# Patient Record
Sex: Female | Born: 1940 | Race: White | Hispanic: No | Marital: Single | Smoking: Never smoker
Health system: Southern US, Community
[De-identification: ages and names within clinical notes are randomized; demographics above are authoritative.]

## PROBLEM LIST (undated history)

## (undated) DIAGNOSIS — F419 Anxiety disorder, unspecified: Secondary | ICD-10-CM

## (undated) DIAGNOSIS — E785 Hyperlipidemia, unspecified: Secondary | ICD-10-CM

## (undated) DIAGNOSIS — E039 Hypothyroidism, unspecified: Secondary | ICD-10-CM

## (undated) DIAGNOSIS — F0280 Dementia in other diseases classified elsewhere without behavioral disturbance: Secondary | ICD-10-CM

## (undated) DIAGNOSIS — F32A Depression, unspecified: Secondary | ICD-10-CM

## (undated) DIAGNOSIS — I4891 Unspecified atrial fibrillation: Secondary | ICD-10-CM

## (undated) DIAGNOSIS — I10 Essential (primary) hypertension: Secondary | ICD-10-CM

---

## 2004-06-30 ENCOUNTER — Ambulatory Visit: Payer: Self-pay

## 2004-07-11 ENCOUNTER — Ambulatory Visit: Payer: Self-pay

## 2005-02-16 ENCOUNTER — Ambulatory Visit: Payer: Self-pay

## 2005-07-03 ENCOUNTER — Ambulatory Visit: Payer: Self-pay

## 2006-09-13 ENCOUNTER — Ambulatory Visit: Payer: Self-pay | Admitting: Family Medicine

## 2007-04-03 ENCOUNTER — Ambulatory Visit: Payer: Self-pay | Admitting: Family Medicine

## 2007-04-25 ENCOUNTER — Ambulatory Visit: Payer: Self-pay | Admitting: Family Medicine

## 2008-02-04 ENCOUNTER — Ambulatory Visit: Payer: Self-pay | Admitting: Family Medicine

## 2009-02-11 IMAGING — CR DG LUMBAR SPINE 2-3V
1 series · 3 of 3 positions shown · non-contrast
Comparison: none

REASON FOR EXAM: PAIN
COMMENTS:

[Series 1: view not recorded · 0.17mm/px · 3 of 3 slices shown]
[im 1/3]
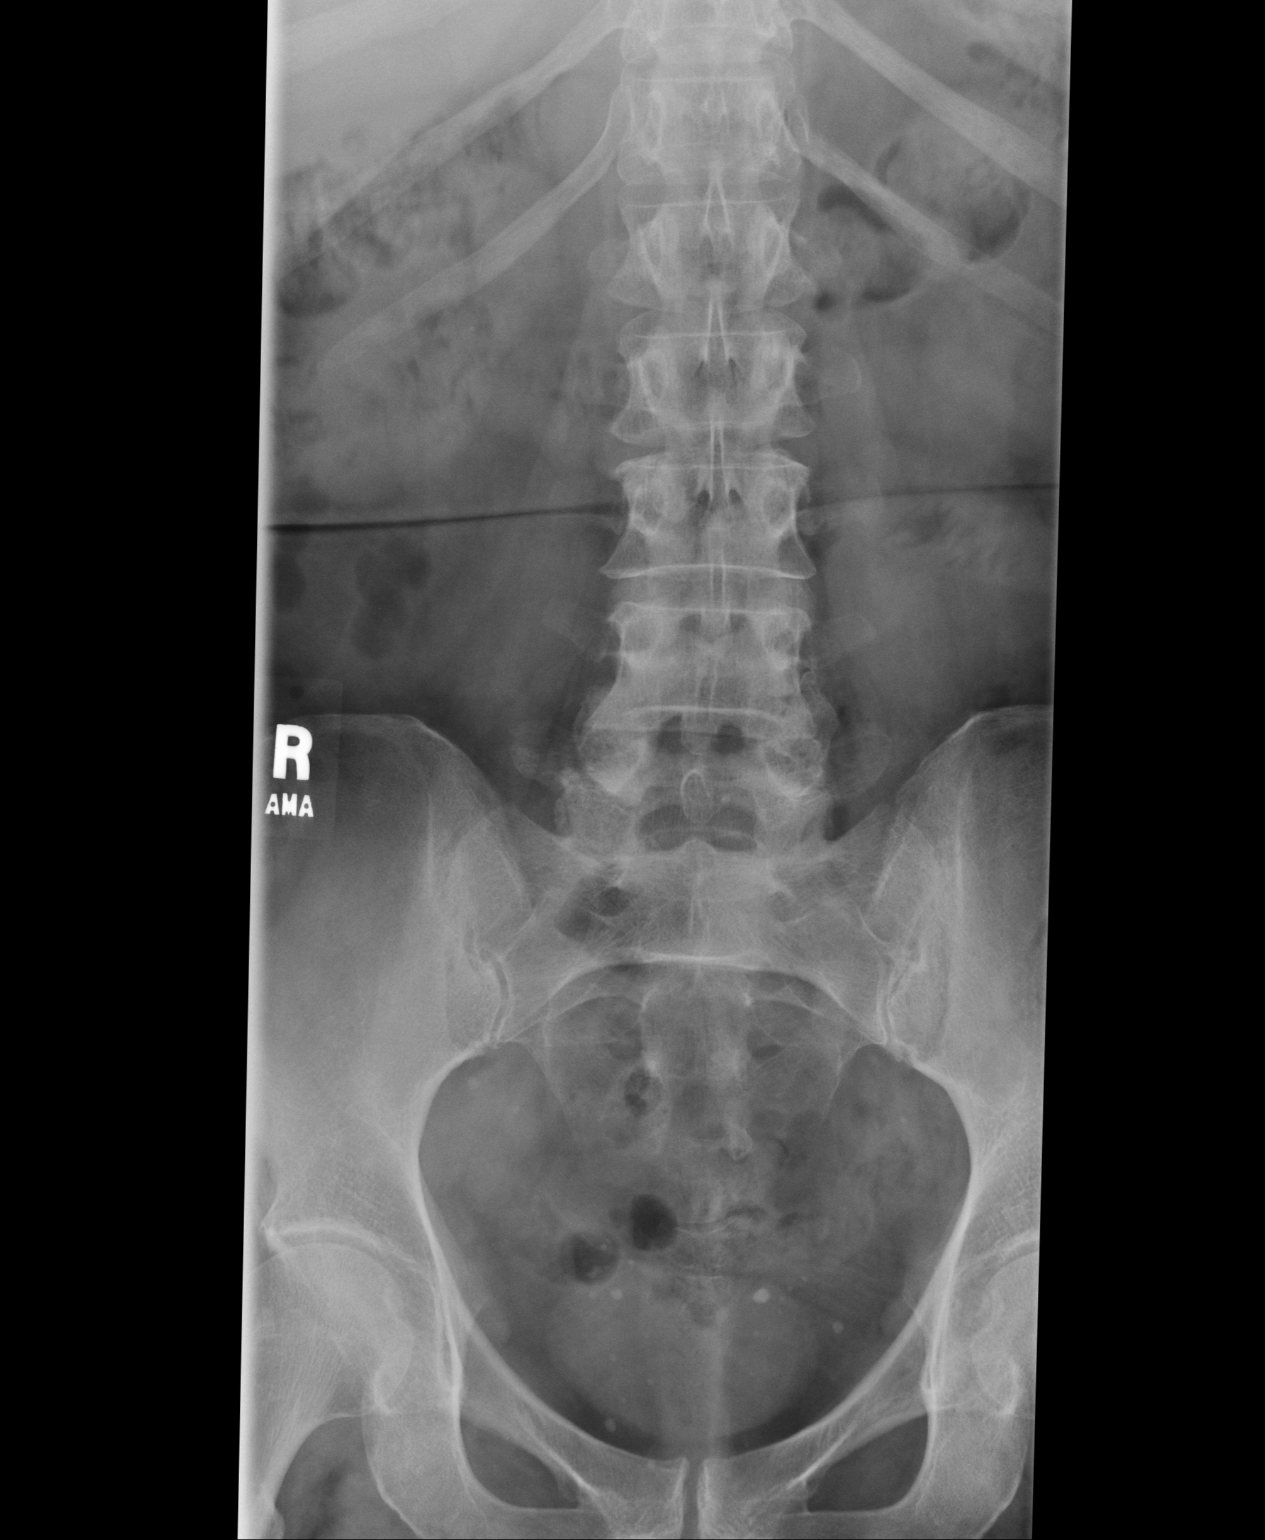
[im 2/3]
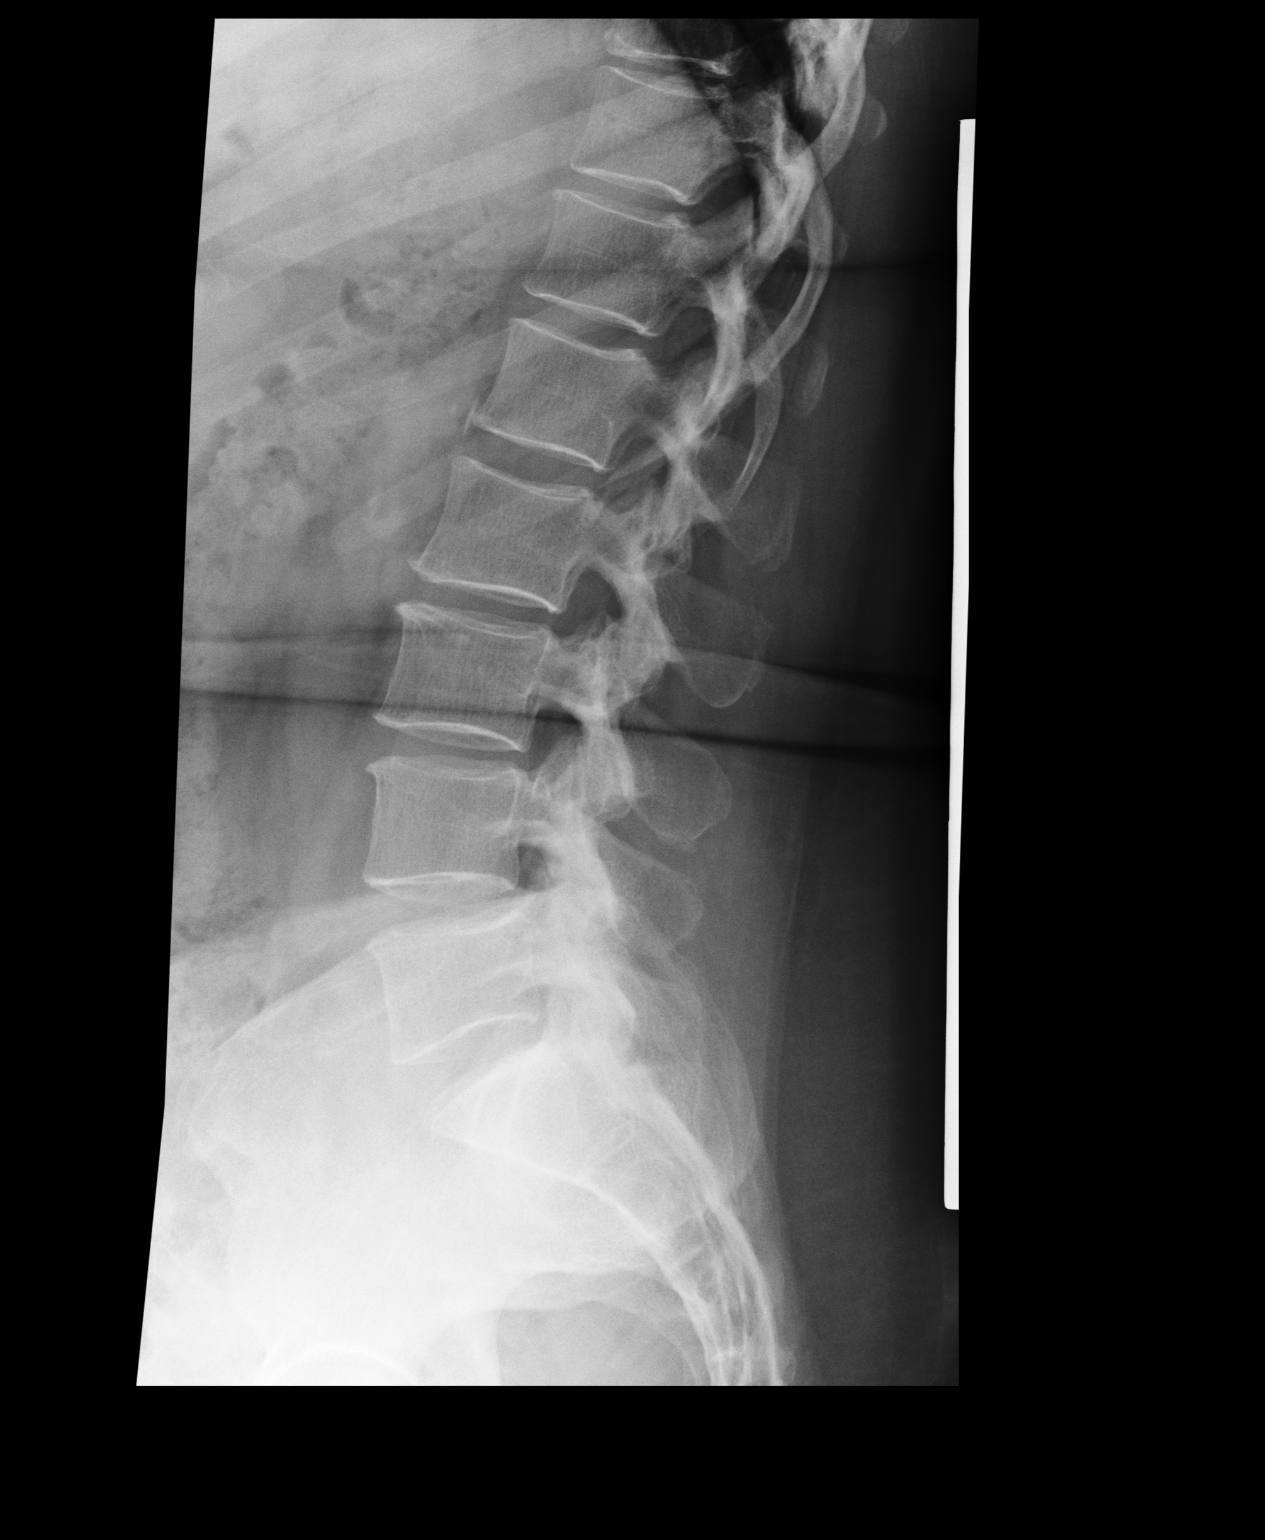
[im 3/3]
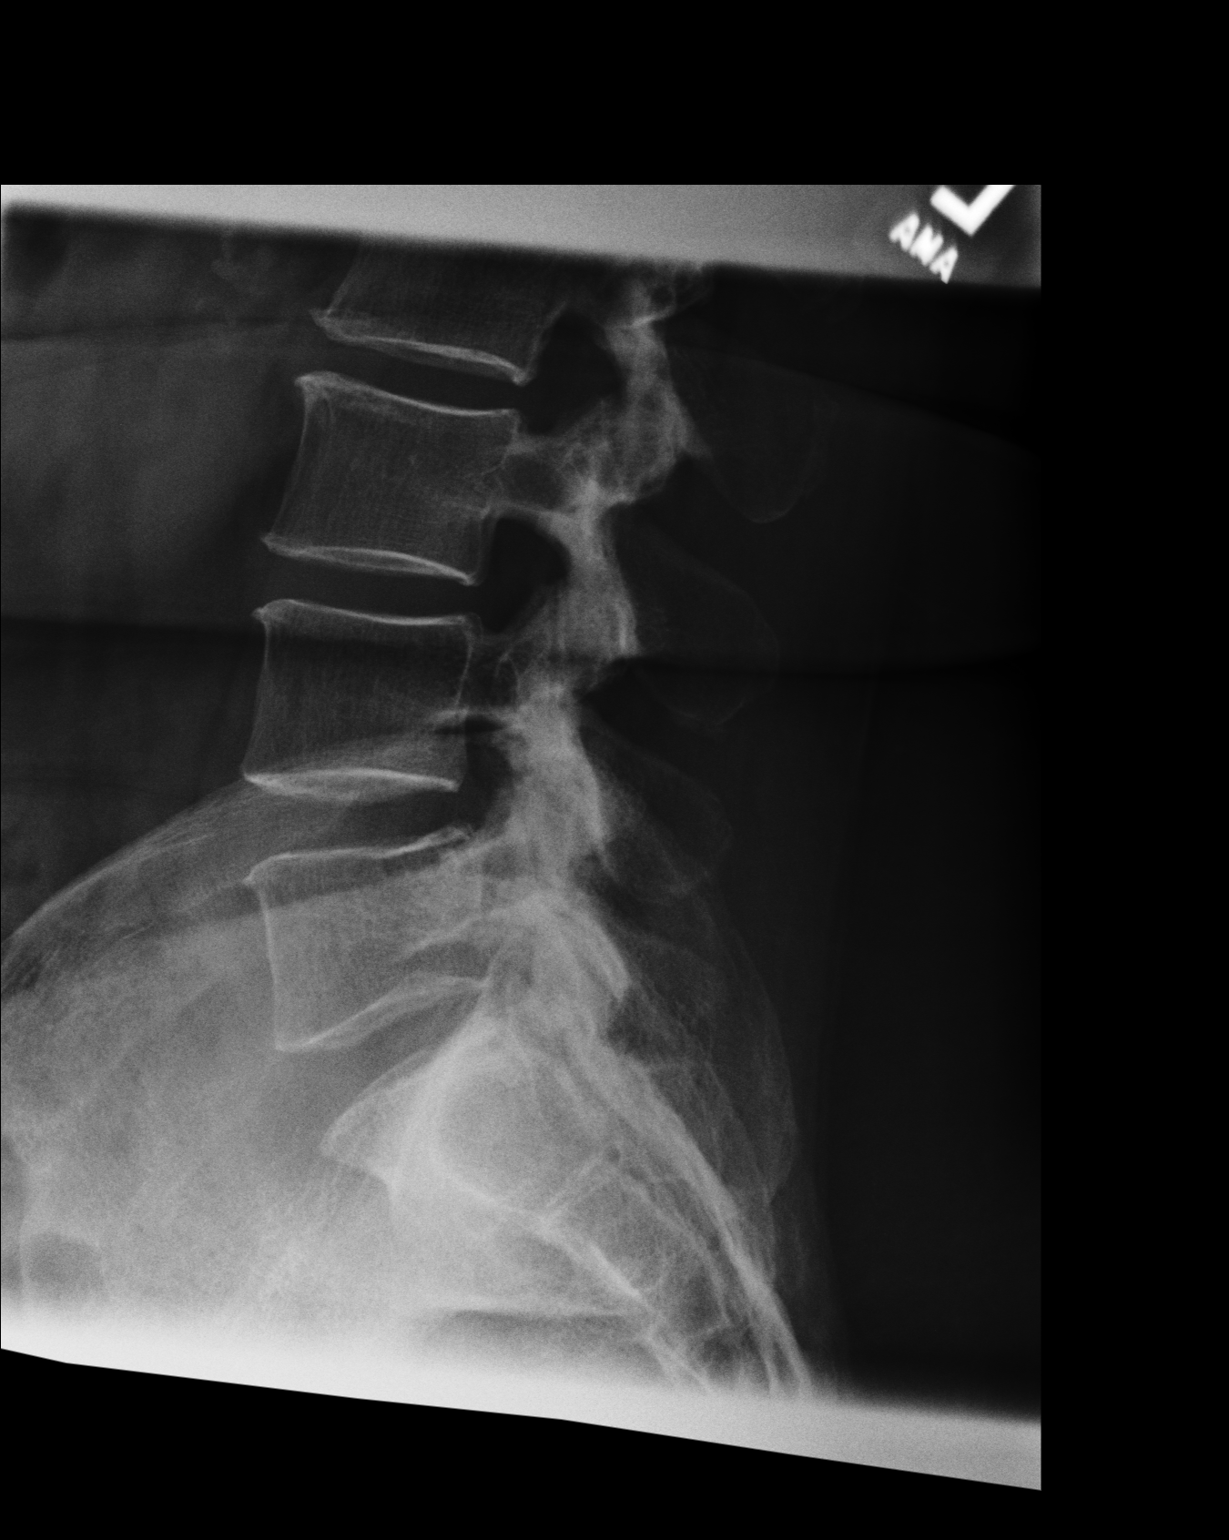

[3 of 3 positions shown; findings below may reference images not displayed]

PROCEDURE:     DXR - DXR LUMBAR SPINE AP AND LATERAL  - April 03, 2007 [DATE]

RESULT:     The vertebral body heights and the intervertebral disc spaces
are well maintained. The vertebral body alignment is normal. There is slight
hypertrophic spurring anteriorly at multiple levels of the lumbar spine. The
pedicles are bilaterally intact.
IMPRESSION: 1. No acute changes are identified.
2. There is slight degenerative spurring anteriorly at multiple levels.

## 2010-01-13 ENCOUNTER — Emergency Department: Payer: Self-pay | Admitting: Emergency Medicine

## 2010-05-30 ENCOUNTER — Ambulatory Visit: Payer: Self-pay | Admitting: General Practice

## 2023-03-12 ENCOUNTER — Encounter: Payer: Self-pay | Admitting: Emergency Medicine

## 2023-03-12 ENCOUNTER — Emergency Department
Admission: EM | Admit: 2023-03-12 | Discharge: 2023-03-12 | Disposition: A | Discharge status: Home / Self Care | Payer: Medicare Other | Attending: Emergency Medicine | Admitting: Emergency Medicine

## 2023-03-12 ENCOUNTER — Other Ambulatory Visit: Payer: Self-pay

## 2023-03-12 ENCOUNTER — Emergency Department: Payer: Medicare Other

## 2023-03-12 DIAGNOSIS — S0083XA Contusion of other part of head, initial encounter: Secondary | ICD-10-CM

## 2023-03-12 DIAGNOSIS — S0011XA Contusion of right eyelid and periocular area, initial encounter: Secondary | ICD-10-CM | POA: Diagnosis not present

## 2023-03-12 DIAGNOSIS — F0390 Unspecified dementia without behavioral disturbance: Secondary | ICD-10-CM | POA: Diagnosis not present

## 2023-03-12 DIAGNOSIS — Z7901 Long term (current) use of anticoagulants: Secondary | ICD-10-CM | POA: Diagnosis not present

## 2023-03-12 DIAGNOSIS — S0990XA Unspecified injury of head, initial encounter: Secondary | ICD-10-CM | POA: Diagnosis present

## 2023-03-12 DIAGNOSIS — I4891 Unspecified atrial fibrillation: Secondary | ICD-10-CM | POA: Diagnosis not present

## 2023-03-12 DIAGNOSIS — E039 Hypothyroidism, unspecified: Secondary | ICD-10-CM | POA: Insufficient documentation

## 2023-03-12 DIAGNOSIS — I10 Essential (primary) hypertension: Secondary | ICD-10-CM | POA: Insufficient documentation

## 2023-03-12 DIAGNOSIS — M25561 Pain in right knee: Secondary | ICD-10-CM | POA: Diagnosis not present

## 2023-03-12 DIAGNOSIS — W19XXXA Unspecified fall, initial encounter: Secondary | ICD-10-CM | POA: Diagnosis not present

## 2023-03-12 DIAGNOSIS — M25562 Pain in left knee: Secondary | ICD-10-CM | POA: Diagnosis not present

## 2023-03-12 HISTORY — DX: Hyperlipidemia, unspecified: E78.5

## 2023-03-12 HISTORY — DX: Dementia in other diseases classified elsewhere, unspecified severity, without behavioral disturbance, psychotic disturbance, mood disturbance, and anxiety: F02.80

## 2023-03-12 HISTORY — DX: Depression, unspecified: F32.A

## 2023-03-12 HISTORY — DX: Unspecified atrial fibrillation: I48.91

## 2023-03-12 HISTORY — DX: Anxiety disorder, unspecified: F41.9

## 2023-03-12 HISTORY — DX: Essential (primary) hypertension: I10

## 2023-03-12 HISTORY — DX: Hypothyroidism, unspecified: E03.9

## 2023-03-12 NOTE — ED Triage Notes (Signed)
Presents via EMS from [facility] s/p fall  Swelling and bruising to right eye and right side of face

## 2023-03-12 NOTE — ED Provider Notes (Signed)
[facility] Emergency Department Provider Note     Event Date/Time   First MD Initiated Contact with Patient 03/12/23 1615     (approximate)   History   Fall (/)   HPI  Savannah Lawrence is a 82 y.o. female With a history of hypothyroidism, HTN, A-fib on Xarelto, anxiety/depression, and dementia, presents to the ED via EMS from her facility. The patient reported to have fallen in her room on Saturday according to staff report to the family. Patient apparently sustained a fall resulting in some facial contusion and ecchymosis.  She presents with bruising to the right and right side of her face.  Patient's daughter and granddaughter present at bedside provides some of the interim history.  Patient does have chronic bilateral knee pain and uses a rollator to ambulate.  Her dementia often finds her trying to ambulate without her walker.  Physical Exam   Triage Vital Signs: ED Triage Vitals  Enc Vitals Group     BP 03/12/23 1604 (!) 158/77     Pulse Rate 03/12/23 1604 (!) 56     Resp 03/12/23 1604 17     Temp 03/12/23 1604 97.9 F (36.6 C)     Temp Source 03/12/23 1604 Oral     SpO2 03/12/23 1604 97 %     Weight 03/12/23 1604 145 lb (65.8 kg)     Height 03/12/23 1605 5\' 4"  (1.626 m)     Head Circumference --      Peak Flow --      Pain Score 03/12/23 1605 2     Pain Loc --      Pain Edu? --      Excl. in GC? --     Most recent vital signs: Vitals:   03/12/23 1604  BP: (!) 158/77  Pulse: (!) 56  Resp: 17  Temp: 97.9 F (36.6 C)  SpO2: 97%    General Awake, no distress.  NAD.  Pleasant and alert to person. HEENT NCAT except for some periorbital ecchymosis noted to the lateral aspect of the right brow and zygomatic cheek.Marland Kitchen PERRL. EOMI. TAVR clear bilaterally.  No rhinorrhea. Mucous membranes are moist.  CV:  Good peripheral perfusion.  RRR RESP:  Normal effort.  CTA ABD:  No distention.  MSK  full active range of motion of the upper and lower  extremities bilaterally.   ED Results / Procedures / Treatments   Labs (all labs ordered are listed, but only abnormal results are displayed) Labs Reviewed - No data to display   EKG   RADIOLOGY  I personally viewed and evaluated these images as part of my medical decision making, as well as reviewing the written report by the radiologist.  ED Provider Interpretation: No acute findings  CT Maxillofacial Wo Contrast  Result Date: 03/12/2023 CLINICAL DATA:  Head trauma, minor (Age >= 65y); Facial trauma, blunt; Neck trauma (Age >= 65y) EXAM: CT HEAD WITHOUT CONTRAST CT MAXILLOFACIAL WITHOUT CONTRAST CT CERVICAL SPINE WITHOUT CONTRAST TECHNIQUE: Multidetector CT imaging of the head, cervical spine, and maxillofacial structures were performed using the standard protocol without intravenous contrast. Multiplanar CT image reconstructions of the cervical spine and maxillofacial structures were also generated. RADIATION DOSE REDUCTION: This exam was performed according to the departmental dose-optimization program which includes automated exposure control, adjustment of the mA and/or kV according to patient size and/or use of iterative reconstruction technique. COMPARISON:  None Available. FINDINGS: CT HEAD FINDINGS Brain: No evidence of acute infarction,  hemorrhage, hydrocephalus, extra-axial collection or mass lesion/mass effect. Vascular: No hyperdense vessel or unexpected calcification. Skull: Soft tissue hematoma along the right frontal scalp. No evidence of underlying calvarial fracture. Other: None. CT MAXILLOFACIAL FINDINGS Osseous: No fracture or mandibular dislocation. No destructive process. Orbits: Negative. No traumatic or inflammatory finding. Sinuses: No middle ear or mastoid impacted cerumen in the right external auditory. Orbits are unremarkable. Soft tissues: Negative. CT CERVICAL SPINE FINDINGS Alignment: Reversal of the normal lordosis.  Grade 1 C4 and on C5 Skull base and vertebrae:  No acute fracture. No primary bone lesion or focal pathologic process. Soft tissues and spinal canal: No prevertebral fluid or swelling. No visible canal hematoma. Disc levels:  No evidence of high-grade spinal canal stenosis Upper chest: Negative. Other: None IMPRESSION: 1. No acute intracranial abnormality. 2. Soft tissue hematoma along the right frontal scalp. No evidence of underlying calvarial fracture. 3. No acute facial bone fracture. 4. No acute fracture or traumatic subluxation of the cervical spine. Electronically Signed   By: Lorenza Cambridge M.D.   On: 03/12/2023 17:38   CT Cervical Spine Wo Contrast  Result Date: 03/12/2023 CLINICAL DATA:  Head trauma, minor (Age >= 65y); Facial trauma, blunt; Neck trauma (Age >= 65y) EXAM: CT HEAD WITHOUT CONTRAST CT MAXILLOFACIAL WITHOUT CONTRAST CT CERVICAL SPINE WITHOUT CONTRAST TECHNIQUE: Multidetector CT imaging of the head, cervical spine, and maxillofacial structures were performed using the standard protocol without intravenous contrast. Multiplanar CT image reconstructions of the cervical spine and maxillofacial structures were also generated. RADIATION DOSE REDUCTION: This exam was performed according to the departmental dose-optimization program which includes automated exposure control, adjustment of the mA and/or kV according to patient size and/or use of iterative reconstruction technique. COMPARISON:  None Available. FINDINGS: CT HEAD FINDINGS Brain: No evidence of acute infarction, hemorrhage, hydrocephalus, extra-axial collection or mass lesion/mass effect. Vascular: No hyperdense vessel or unexpected calcification. Skull: Soft tissue hematoma along the right frontal scalp. No evidence of underlying calvarial fracture. Other: None. CT MAXILLOFACIAL FINDINGS Osseous: No fracture or mandibular dislocation. No destructive process. Orbits: Negative. No traumatic or inflammatory finding. Sinuses: No middle ear or mastoid impacted cerumen in the right  external auditory. Orbits are unremarkable. Soft tissues: Negative. CT CERVICAL SPINE FINDINGS Alignment: Reversal of the normal lordosis.  Grade 1 C4 and on C5 Skull base and vertebrae: No acute fracture. No primary bone lesion or focal pathologic process. Soft tissues and spinal canal: No prevertebral fluid or swelling. No visible canal hematoma. Disc levels:  No evidence of high-grade spinal canal stenosis Upper chest: Negative. Other: None IMPRESSION: 1. No acute intracranial abnormality. 2. Soft tissue hematoma along the right frontal scalp. No evidence of underlying calvarial fracture. 3. No acute facial bone fracture. 4. No acute fracture or traumatic subluxation of the cervical spine. Electronically Signed   By: Lorenza Cambridge M.D.   On: 03/12/2023 17:38   CT HEAD WO CONTRAST ( )  Result Date: 03/12/2023 CLINICAL DATA:  Head trauma, minor (Age >= 65y); Facial trauma, blunt; Neck trauma (Age >= 65y) EXAM: CT HEAD WITHOUT CONTRAST CT MAXILLOFACIAL WITHOUT CONTRAST CT CERVICAL SPINE WITHOUT CONTRAST TECHNIQUE: Multidetector CT imaging of the head, cervical spine, and maxillofacial structures were performed using the standard protocol without intravenous contrast. Multiplanar CT image reconstructions of the cervical spine and maxillofacial structures were also generated. RADIATION DOSE REDUCTION: This exam was performed according to the departmental dose-optimization program which includes automated exposure control, adjustment of the mA and/or kV according to patient size and/or use  of iterative reconstruction technique. COMPARISON:  None Available. FINDINGS: CT HEAD FINDINGS Brain: No evidence of acute infarction, hemorrhage, hydrocephalus, extra-axial collection or mass lesion/mass effect. Vascular: No hyperdense vessel or unexpected calcification. Skull: Soft tissue hematoma along the right frontal scalp. No evidence of underlying calvarial fracture. Other: None. CT MAXILLOFACIAL FINDINGS Osseous: No  fracture or mandibular dislocation. No destructive process. Orbits: Negative. No traumatic or inflammatory finding. Sinuses: No middle ear or mastoid impacted cerumen in the right external auditory. Orbits are unremarkable. Soft tissues: Negative. CT CERVICAL SPINE FINDINGS Alignment: Reversal of the normal lordosis.  Grade 1 C4 and on C5 Skull base and vertebrae: No acute fracture. No primary bone lesion or focal pathologic process. Soft tissues and spinal canal: No prevertebral fluid or swelling. No visible canal hematoma. Disc levels:  No evidence of high-grade spinal canal stenosis Upper chest: Negative. Other: None IMPRESSION: 1. No acute intracranial abnormality. 2. Soft tissue hematoma along the right frontal scalp. No evidence of underlying calvarial fracture. 3. No acute facial bone fracture. 4. No acute fracture or traumatic subluxation of the cervical spine. Electronically Signed   By: Lorenza Cambridge M.D.   On: 03/12/2023 17:38     PROCEDURES:  Critical Care performed: No  Procedures   MEDICATIONS ORDERED IN ED: Medications - No data to display   IMPRESSION / MDM / ASSESSMENT AND PLAN / ED COURSE  I reviewed the triage vital signs and the nursing notes.                              Differential diagnosis includes, but is not limited to, SDH, skull fracture, cervical fracture, cervical radiculopathy, facial bone fracture, hematoma  Patient's presentation is most consistent with acute complicated illness / injury requiring diagnostic workup.  Patient's diagnosis is consistent with a chemical fall resulting in facial trauma.  No radiologic evidence of any acute intracranial process, facial fracture, or cervical fracture, based on interpretation of images.  She is stable at this time without ongoing complaints of pain.  Patient will be discharged home with directions to take OTC Tylenol as needed. Patient is to follow up with her primary provider at the facility, as needed or otherwise  directed. Patient is given ED precautions to return to the ED for any worsening or new symptoms.   FINAL CLINICAL IMPRESSION(S) / ED DIAGNOSES   Final diagnoses:  Fall, initial encounter  Contusion of face, initial encounter     Rx / DC Orders   ED Discharge Orders     None        Note:  This document was prepared using Dragon voice recognition software and may include unintentional dictation errors.    Lissa Hoard, PA-C 03/13/23 0018    Jene Every, MD 03/13/23 1047

## 2023-03-12 NOTE — Discharge Instructions (Signed)
Ms. Savannah Lawrence has a normal exam at this time.  Her CT scans are negative for any acute findings related to her fall.  She will experience ongoing bruising which will resolve without intervention.  This is a complication of her blood thinner use.  Follow-up with the primary provider and give Tylenol as needed for pain relief.

## 2023-07-02 ENCOUNTER — Other Ambulatory Visit: Payer: Self-pay

## 2023-07-02 ENCOUNTER — Emergency Department
Admission: EM | Admit: 2023-07-02 | Discharge: 2023-07-02 | Disposition: A | Discharge status: Home / Self Care | Attending: Emergency Medicine | Admitting: Emergency Medicine

## 2023-07-02 DIAGNOSIS — E039 Hypothyroidism, unspecified: Secondary | ICD-10-CM | POA: Diagnosis not present

## 2023-07-02 DIAGNOSIS — I10 Essential (primary) hypertension: Secondary | ICD-10-CM | POA: Insufficient documentation

## 2023-07-02 DIAGNOSIS — N390 Urinary tract infection, site not specified: Secondary | ICD-10-CM | POA: Insufficient documentation

## 2023-07-02 DIAGNOSIS — B9689 Other specified bacterial agents as the cause of diseases classified elsewhere: Secondary | ICD-10-CM | POA: Diagnosis not present

## 2023-07-02 DIAGNOSIS — L89159 Pressure ulcer of sacral region, unspecified stage: Secondary | ICD-10-CM | POA: Insufficient documentation

## 2023-07-02 DIAGNOSIS — F0390 Unspecified dementia without behavioral disturbance: Secondary | ICD-10-CM | POA: Insufficient documentation

## 2023-07-02 DIAGNOSIS — R040 Epistaxis: Secondary | ICD-10-CM | POA: Diagnosis present

## 2023-07-02 DIAGNOSIS — L98421 Non-pressure chronic ulcer of back limited to breakdown of skin: Secondary | ICD-10-CM

## 2023-07-02 LAB — URINALYSIS, ROUTINE W REFLEX MICROSCOPIC
Bilirubin Urine: NEGATIVE
Glucose, UA: NEGATIVE mg/dL
Hgb urine dipstick: NEGATIVE
Ketones, ur: NEGATIVE mg/dL
Nitrite: POSITIVE — AB
Protein, ur: NEGATIVE mg/dL
Specific Gravity, Urine: 1.019 (ref 1.005–1.030)
WBC, UA: >50 WBC/hpf (ref 0–5)
pH: 7 (ref 5.0–8.0)

## 2023-07-02 MED ORDER — LEVOFLOXACIN 500 MG PO TABS: 500.0000 mg | ORAL_TABLET | Freq: Every day | ORAL | 0 refills | Status: AC

## 2023-07-02 MED ORDER — LEVOFLOXACIN 500 MG PO TABS
500.0000 mg | ORAL_TABLET | Freq: Once | ORAL | Status: AC
  Administered 2023-07-02: 500 mg via ORAL
  Filled 2023-07-02: qty 1

## 2023-07-02 MED ORDER — CLOTRIMAZOLE-BETAMETHASONE 1-0.05 % EX CREA
TOPICAL_CREAM | Freq: Once | CUTANEOUS | Status: AC
  Filled 2023-07-02: qty 15

## 2023-07-02 MED ORDER — BACITRACIN ZINC 500 UNIT/GM EX OINT
TOPICAL_OINTMENT | Freq: Once | CUTANEOUS | Status: AC
  Filled 2023-07-02: qty 0.9

## 2023-07-02 MED ORDER — CLOTRIMAZOLE-BETAMETHASONE 1-0.05 % EX CREA: 1.0000 | TOPICAL_CREAM | Freq: Two times a day (BID) | CUTANEOUS | 0 refills | Status: AC

## 2023-07-02 NOTE — ED Triage Notes (Signed)
Pt comes via EMS from [facility] with nose bleed that started this morning. Pt has dementia. Pt keeps wiping nose and lips an sniffs then the bleeding starts up again. VSS pt not on thinners  No bleeding noted currently by this RN

## 2023-07-02 NOTE — ED Provider Notes (Signed)
[facility] Provider Note    Event Date/Time   First MD Initiated Contact with Patient 07/02/23 [phone]     (approximate)   History   Epistaxis   HPI  Savannah Lawrence is a 82 y.o. female with history of dementia, A-fib, hypertension, hypothyroidism and hyperlipidemia presents emergency department from [facility] nursing home with complaints of a nosebleed that starts morning.  Patient keeps picking at the area and will have some bleeding.  No active bleeding noted in triage.  Patient unable to give accurate HPI\ROS due to dementia      Physical Exam   Triage Vital Signs: ED Triage Vitals [07/02/23 0739]  Encounter Vitals Group     BP      Systolic BP Percentile      Diastolic BP Percentile      Pulse      Resp      Temp      Temp src      SpO2      Weight      Height      Head Circumference      Peak Flow      Pain Score 0     Pain Loc      Pain Education      Exclude from Growth Chart     Most recent vital signs: Vitals:   07/02/23 0916  BP: (!) 126/91  Pulse: 80  Resp: 18  Temp: 98.8 F (37.1 C)  SpO2: 96%     General: Awake, no distress.   CV:  Good peripheral perfusion. regular rate and  rhythm Resp:  Normal effort. Lungscta Abd:  No distention.   Other:  Right side of the nasal cavity has a small clot noted, no active bleeding, of note the patient smells like urine    ED Results / Procedures / Treatments   Labs (all labs ordered are listed, but only abnormal results are displayed) Labs Reviewed  URINALYSIS, ROUTINE W REFLEX MICROSCOPIC - Abnormal; Notable for the following components:      Result Value   Color, Urine YELLOW (*)    APPearance HAZY (*)    Nitrite POSITIVE (*)    Leukocytes,Ua LARGE (*)    Bacteria, UA MANY (*)    All other components within normal limits  URINE CULTURE     EKG     RADIOLOGY     PROCEDURES:   Procedures   MEDICATIONS ORDERED IN ED: Medications   clotrimazole-betamethasone (LOTRISONE) cream (has no administration in time range)  levofloxacin (LEVAQUIN) tablet 500 mg (has no administration in time range)  bacitracin ointment ( Topical Given by Other 07/02/23 0859)     IMPRESSION / MDM / ASSESSMENT AND PLAN / ED COURSE  I reviewed the triage vital signs and the nursing notes.                              Differential diagnosis includes, but is not limited to, epistaxis, sinusitis, UTI,   Patient's presentation is most consistent with acute illness / injury with system symptoms.   Epistaxis has resolved do not feel that the patient needs nasal packing.  However the urine odor has a foul smell so we will do UA for UTI.  Of note when nursing staff changed patient's diaper it weighed about 2 pounds and the patient had 3 diapers on.  The second 2 were dry.  UA is concerning  for recurrent UTI.  Positive for nitrites, large amount of leuks, greater than 50 WBCs and many bacteria.  Urine culture ordered  Noted during change of the diaper was breakdown of skin along sacrum, a lot of redness and irritation noted along the vaginal creases and perineum.  Had nursing staff apply a sacral pressure pad, apply Lotrisone to the affected areas.  She will be given Levaquin 500 mg p.o. prior to discharge due to the UTI.  Had [place] discussion with the daughter.  She did take pictures of the skin breakdown to show the nursing home.  They need to turn her more often and change her diaper more frequently.  Her daughter is aware and will convey the information to the nursing facility.  She is to return emergency department for worsening.  She was discharged in stable condition.     FINAL CLINICAL IMPRESSION(S) / ED DIAGNOSES   Final diagnoses:  Anterior epistaxis  Acute UTI  Skin ulcer of sacrum, limited to breakdown of skin (HCC)     Rx / DC Orders   ED Discharge Orders          Ordered    levofloxacin (LEVAQUIN) 500 MG tablet  Daily         07/02/23 0941    clotrimazole-betamethasone (LOTRISONE) cream  2 times daily        07/02/23 0941             Note:  This document was prepared using Dragon voice recognition software and may include unintentional dictation errors.    Faythe Ghee, PA-C 07/02/23 1610    Pilar Jarvis, MD 07/02/23 [phone]

## 2023-07-02 NOTE — ED Notes (Signed)
See triage note  Presents with family from [facility]  Family states she developed a nose bleed this am   On arrival dried blood noted around nare  Pt also smells of urine

## 2023-07-02 NOTE — Discharge Instructions (Addendum)
Change the patient's diaper more frequently as she has a UTI. Give her the medication as prescribed Apply the cream twice daily for 7 days If she has another nosebleed, apply pressure for 20 minutes.  Return the emergency department if worsening

## 2023-07-04 LAB — URINE CULTURE: Culture: >=100000 — AB

## 2023-07-05 NOTE — Progress Notes (Signed)
ED Antimicrobial Stewardship Positive Culture Follow Up   BAYLEE MALANGA is an 82 y.o. female who presented to [facility] on 07/02/2023 with a chief complaint of nose bleed. UA check in response to odor from urine in diaper.   Chief Complaint  Patient presents with   Epistaxis   Recent Results (from the past 720 hour(s))  Urine Culture     Status: Abnormal   Collection Time: 07/02/23  8:50 AM   Specimen: In/Out Cath Urine  Result Value Ref Range Status   Specimen Description   Final    IN/OUT CATH URINE Performed at [facility], [address] [place], [place] [address]    Special Requests   Final    NONE Performed at [facility], [address] [place], [place] [address]    Culture >=100,000 COLONIES/mL ESCHERICHIA COLI (A)  Final   Report Status 07/04/2023 FINAL  Final   Organism ID, Bacteria ESCHERICHIA COLI (A)  Final      Susceptibility   Escherichia coli - MIC*    AMPICILLIN >=32 RESISTANT Resistant     CEFAZOLIN 8 SENSITIVE Sensitive     CEFEPIME <=0.12 SENSITIVE Sensitive     CEFTRIAXONE <=0.25 SENSITIVE Sensitive     CIPROFLOXACIN >=4 RESISTANT Resistant     GENTAMICIN <=1 SENSITIVE Sensitive     IMIPENEM <=0.25 SENSITIVE Sensitive     NITROFURANTOIN <=16 SENSITIVE Sensitive     TRIMETH/SULFA <=20 SENSITIVE Sensitive     AMPICILLIN/SULBACTAM >=32 RESISTANT Resistant     PIP/TAZO <=4 SENSITIVE Sensitive ug/mL    * >=100,000 COLONIES/mL ESCHERICHIA COLI   [x]  Treated with levofloxacin 500 mg PO x 7 days, organism resistant to prescribed antimicrobial []  Patient discharged originally without antimicrobial agent and treatment is now indicated  New antibiotic prescription:  Bactrim DS BID x 3 days  ED Provider:  Shaune Pollack, MD   Called [facility] Faxed the ED report to [facility] Included the recommendation above Left phone number to call back with any questions or concerns after they rounded on the patient this AM  Effie Shy, PharmD Pharmacy Resident  07/05/2023 9:11 AM
